# Patient Record
Sex: Male | Born: 2000
Health system: Southern US, Community
[De-identification: ages and names within clinical notes are randomized; demographics above are authoritative.]

## PROBLEM LIST (undated history)

## (undated) DIAGNOSIS — F909 Attention-deficit hyperactivity disorder, unspecified type: Secondary | ICD-10-CM

## (undated) DIAGNOSIS — F329 Major depressive disorder, single episode, unspecified: Secondary | ICD-10-CM

## (undated) DIAGNOSIS — K219 Gastro-esophageal reflux disease without esophagitis: Secondary | ICD-10-CM

## (undated) DIAGNOSIS — F32A Depression, unspecified: Secondary | ICD-10-CM

## (undated) DIAGNOSIS — F419 Anxiety disorder, unspecified: Secondary | ICD-10-CM

## (undated) HISTORY — DX: Anxiety disorder, unspecified: F41.9

## (undated) HISTORY — DX: Attention-deficit hyperactivity disorder, unspecified type: F90.9

## (undated) HISTORY — DX: Gastro-esophageal reflux disease without esophagitis: K21.9

---
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
## 2018-01-02 ENCOUNTER — Ambulatory Visit (INDEPENDENT_AMBULATORY_CARE_PROVIDER_SITE_OTHER): Payer: BLUE CROSS/BLUE SHIELD | Admitting: General Surgery

## 2018-01-02 ENCOUNTER — Encounter: Payer: Self-pay | Admitting: General Surgery

## 2018-01-02 VITALS — BP 136/83 | HR 72 | Temp 98.0°F | Resp 16 | Wt 145.0 lb

## 2018-01-02 DIAGNOSIS — L03115 Cellulitis of right lower limb: Secondary | ICD-10-CM | POA: Diagnosis not present

## 2018-01-02 NOTE — Progress Notes (Signed)
Subjective:     Robert Bowman  Here for follow-up of cellulitis of the right leg.  The knot that was present before has resolved. Objective:    BP 136/83 (BP Location: Left Arm, Patient Position: Sitting, Cuff Size: Normal)   Pulse 72   Temp 98 F (36.7 C) (Temporal)   Resp 16   Wt 145 lb (65.8 kg)   BMI 18.62 kg/m   General:  alert, cooperative and no distress  Right leg reveals residual discoloration over the previously documented lump.  No induration or residual mass noted.  No lymphadenopathy noted.     Assessment:    Cellulitis of right leg, resolved.  Suspect this was a cyst that has since resolved.    Plan:   Follow-up as needed.  Patient was instructed to return should the lump recur.  He understands and agrees.

## 2018-04-11 DIAGNOSIS — S62392A Other fracture of third metacarpal bone, right hand, initial encounter for closed fracture: Secondary | ICD-10-CM | POA: Diagnosis not present

## 2018-04-11 DIAGNOSIS — M79641 Pain in right hand: Secondary | ICD-10-CM | POA: Diagnosis not present

## 2018-04-11 DIAGNOSIS — W228XXA Striking against or struck by other objects, initial encounter: Secondary | ICD-10-CM | POA: Diagnosis not present

## 2018-04-14 DIAGNOSIS — S62352D Nondisplaced fracture of shaft of third metacarpal bone, right hand, subsequent encounter for fracture with routine healing: Secondary | ICD-10-CM | POA: Diagnosis not present

## 2018-04-29 DIAGNOSIS — S62352D Nondisplaced fracture of shaft of third metacarpal bone, right hand, subsequent encounter for fracture with routine healing: Secondary | ICD-10-CM | POA: Diagnosis not present

## 2018-05-16 DIAGNOSIS — Z23 Encounter for immunization: Secondary | ICD-10-CM | POA: Diagnosis not present
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
## 2020-05-10 DIAGNOSIS — Z20828 Contact with and (suspected) exposure to other viral communicable diseases: Secondary | ICD-10-CM | POA: Diagnosis not present
